# Patient Record
Sex: Female | Born: 1960 | Race: White | Hispanic: No | Smoking: Current every day smoker
Health system: Southern US, Community
[De-identification: ages and names within clinical notes are randomized; demographics above are authoritative.]

## PROBLEM LIST (undated history)

## (undated) DIAGNOSIS — M5432 Sciatica, left side: Secondary | ICD-10-CM

## (undated) DIAGNOSIS — F410 Panic disorder [episodic paroxysmal anxiety] without agoraphobia: Secondary | ICD-10-CM

## (undated) DIAGNOSIS — E785 Hyperlipidemia, unspecified: Secondary | ICD-10-CM

## (undated) DIAGNOSIS — F322 Major depressive disorder, single episode, severe without psychotic features: Secondary | ICD-10-CM

## (undated) DIAGNOSIS — E2839 Other primary ovarian failure: Secondary | ICD-10-CM

## (undated) DIAGNOSIS — D72829 Elevated white blood cell count, unspecified: Secondary | ICD-10-CM

## (undated) DIAGNOSIS — N390 Urinary tract infection, site not specified: Secondary | ICD-10-CM

## (undated) DIAGNOSIS — R161 Splenomegaly, not elsewhere classified: Secondary | ICD-10-CM

## (undated) DIAGNOSIS — J45909 Unspecified asthma, uncomplicated: Secondary | ICD-10-CM

## (undated) DIAGNOSIS — F17210 Nicotine dependence, cigarettes, uncomplicated: Secondary | ICD-10-CM

## (undated) DIAGNOSIS — I771 Stricture of artery: Secondary | ICD-10-CM

## (undated) DIAGNOSIS — K219 Gastro-esophageal reflux disease without esophagitis: Secondary | ICD-10-CM

## (undated) DIAGNOSIS — R1011 Right upper quadrant pain: Secondary | ICD-10-CM

## (undated) DIAGNOSIS — G4762 Sleep related leg cramps: Secondary | ICD-10-CM

## (undated) DIAGNOSIS — I774 Celiac artery compression syndrome: Secondary | ICD-10-CM

## (undated) DIAGNOSIS — J302 Other seasonal allergic rhinitis: Secondary | ICD-10-CM

## (undated) DIAGNOSIS — I10 Essential (primary) hypertension: Secondary | ICD-10-CM

## (undated) HISTORY — DX: Right upper quadrant pain: R10.11

## (undated) HISTORY — DX: Unspecified asthma, uncomplicated: J45.909

## (undated) HISTORY — DX: Celiac artery compression syndrome: I77.4

## (undated) HISTORY — DX: Urinary tract infection, site not specified: N39.0

## (undated) HISTORY — DX: Elevated white blood cell count, unspecified: D72.829

## (undated) HISTORY — DX: Other primary ovarian failure: E28.39

## (undated) HISTORY — DX: Gastro-esophageal reflux disease without esophagitis: K21.9

## (undated) HISTORY — DX: Sleep related leg cramps: G47.62

## (undated) HISTORY — DX: Stricture of artery: I77.1

## (undated) HISTORY — DX: Other seasonal allergic rhinitis: J30.2

## (undated) HISTORY — DX: Sciatica, left side: M54.32

## (undated) HISTORY — DX: Essential (primary) hypertension: I10

## (undated) HISTORY — DX: Splenomegaly, not elsewhere classified: R16.1

## (undated) HISTORY — DX: Hyperlipidemia, unspecified: E78.5

## (undated) HISTORY — DX: Panic disorder (episodic paroxysmal anxiety): F41.0

## (undated) HISTORY — DX: Major depressive disorder, single episode, severe without psychotic features: F32.2

## (undated) HISTORY — PX: KIDNEY STONE SURGERY: SHX686

## (undated) HISTORY — DX: Nicotine dependence, cigarettes, uncomplicated: F17.210

---
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
## 1984-08-11 HISTORY — PX: CHOLECYSTECTOMY: SHX55

## 1987-08-12 HISTORY — PX: ABDOMINAL HYSTERECTOMY: SHX81

## 2003-08-12 HISTORY — PX: NASAL SINUS SURGERY: SHX719

## 2007-04-08 ENCOUNTER — Ambulatory Visit (HOSPITAL_COMMUNITY): Admission: RE | Admit: 2007-04-08 | Discharge: 2007-04-08 | Payer: Self-pay | Admitting: Obstetrics and Gynecology

## 2007-07-22 ENCOUNTER — Ambulatory Visit (HOSPITAL_COMMUNITY): Admission: RE | Admit: 2007-07-22 | Discharge: 2007-07-22 | Payer: Self-pay | Admitting: Family Medicine

## 2008-04-11 ENCOUNTER — Other Ambulatory Visit: Admission: RE | Admit: 2008-04-11 | Discharge: 2008-04-11 | Payer: Self-pay | Admitting: Obstetrics and Gynecology

## 2009-12-21 ENCOUNTER — Ambulatory Visit (HOSPITAL_COMMUNITY): Admission: RE | Admit: 2009-12-21 | Discharge: 2009-12-21 | Payer: Self-pay | Admitting: Family Medicine

## 2010-03-29 ENCOUNTER — Ambulatory Visit (HOSPITAL_COMMUNITY): Admission: RE | Admit: 2010-03-29 | Discharge: 2010-03-29 | Payer: Self-pay | Admitting: Family Medicine

## 2010-08-11 HISTORY — PX: BACK SURGERY: SHX140

## 2010-09-01 ENCOUNTER — Encounter: Payer: Self-pay | Admitting: Obstetrics and Gynecology

## 2010-09-01 ENCOUNTER — Encounter: Payer: Self-pay | Admitting: Family Medicine

## 2011-05-21 IMAGING — CR DG LUMBAR SPINE COMPLETE 4+V
5 series · 5 of 5 positions shown · non-contrast
Comparison: None.

CLINICAL DATA: Low back pain

LUMBAR SPINE - COMPLETE 4+ VIEW

[view not recorded (1 of 5)]
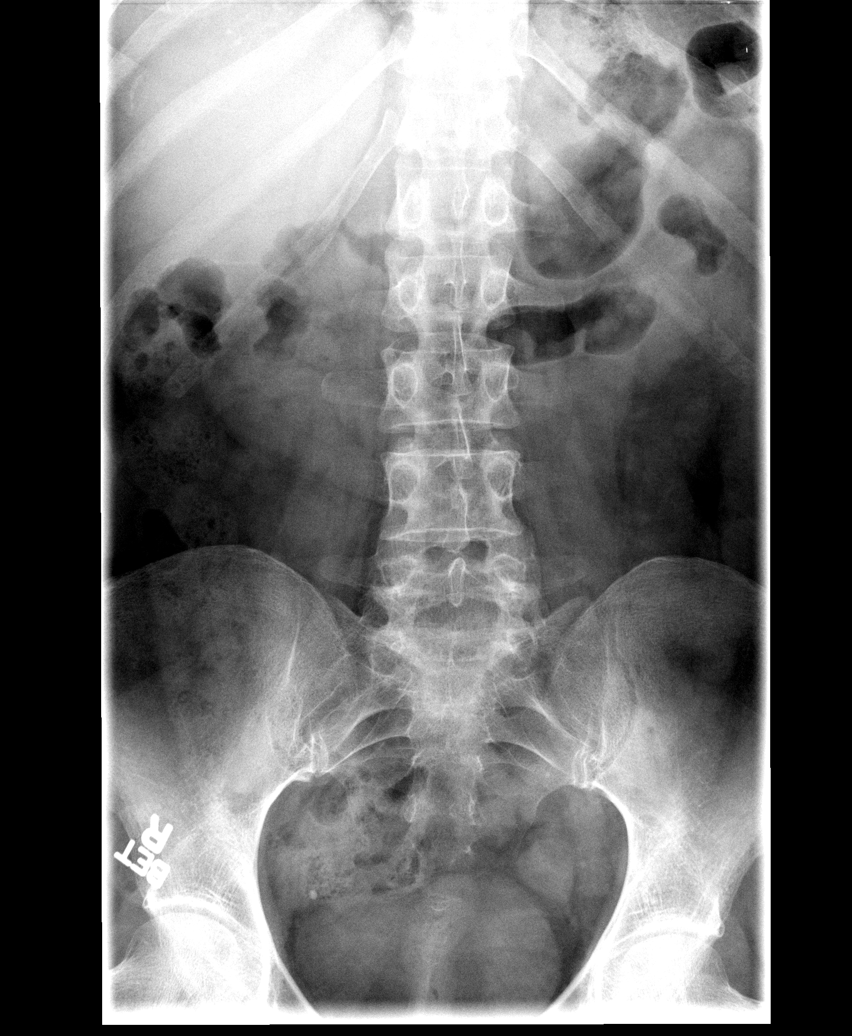

[view not recorded (2 of 5)]
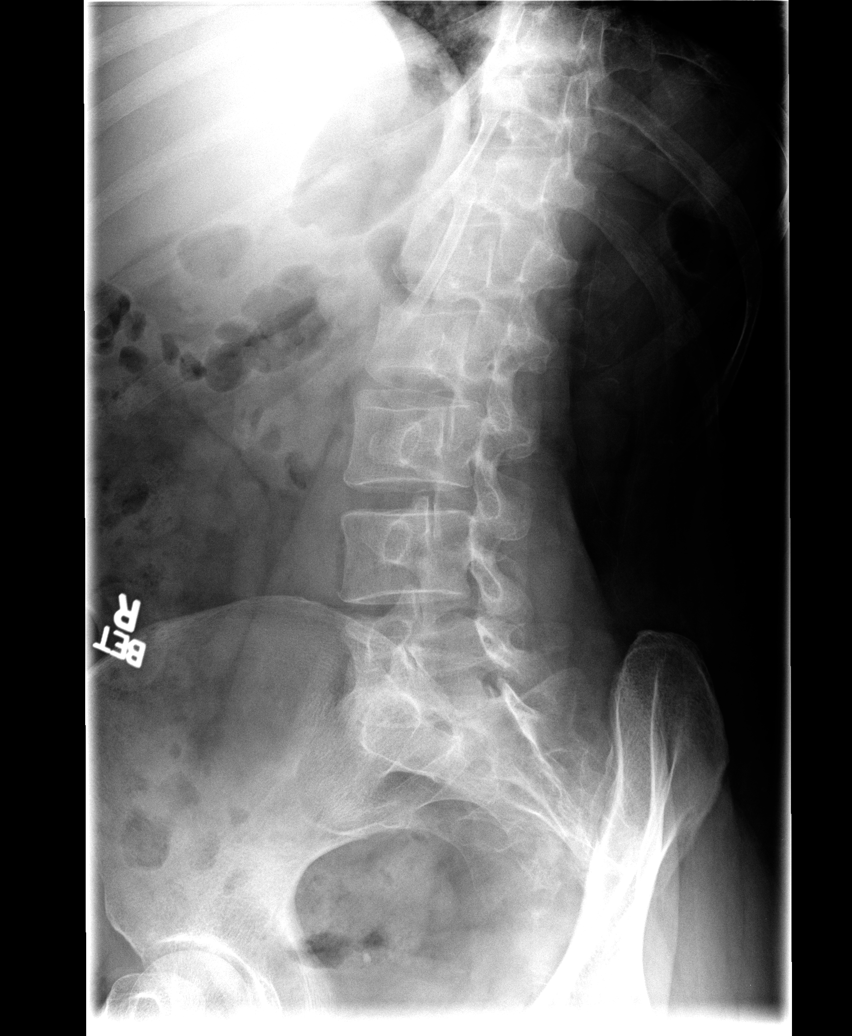

[view not recorded (3 of 5)]
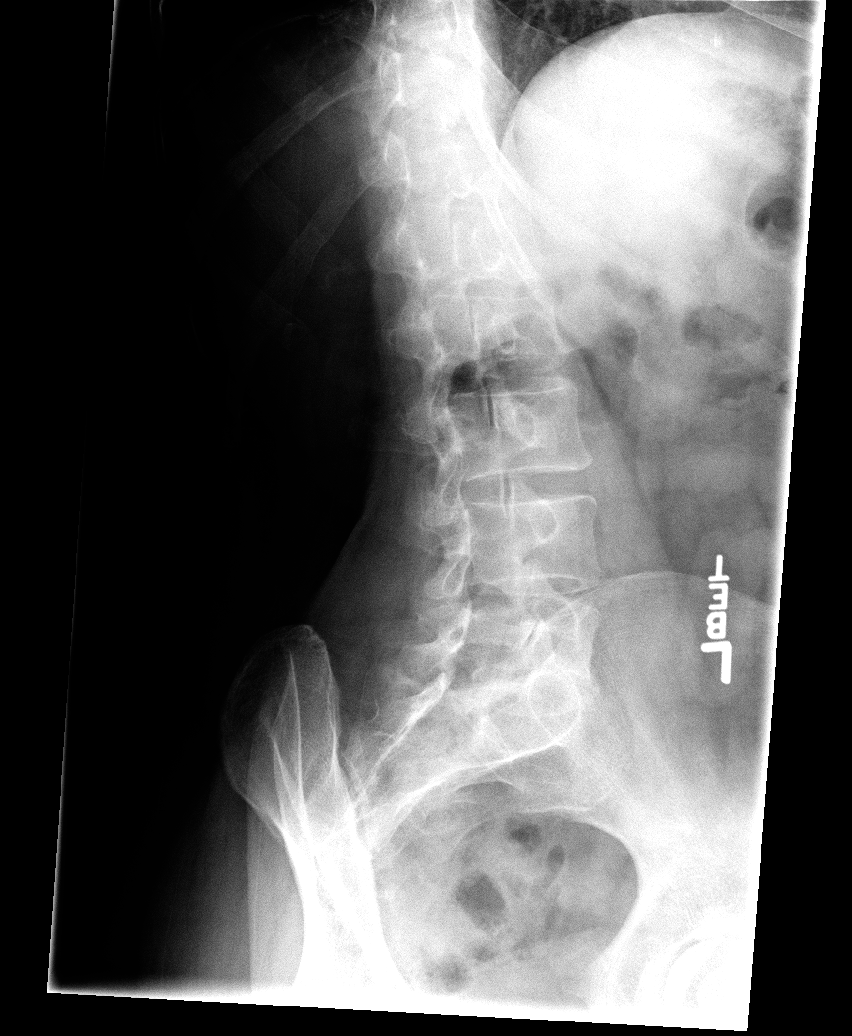

[view not recorded (4 of 5)]
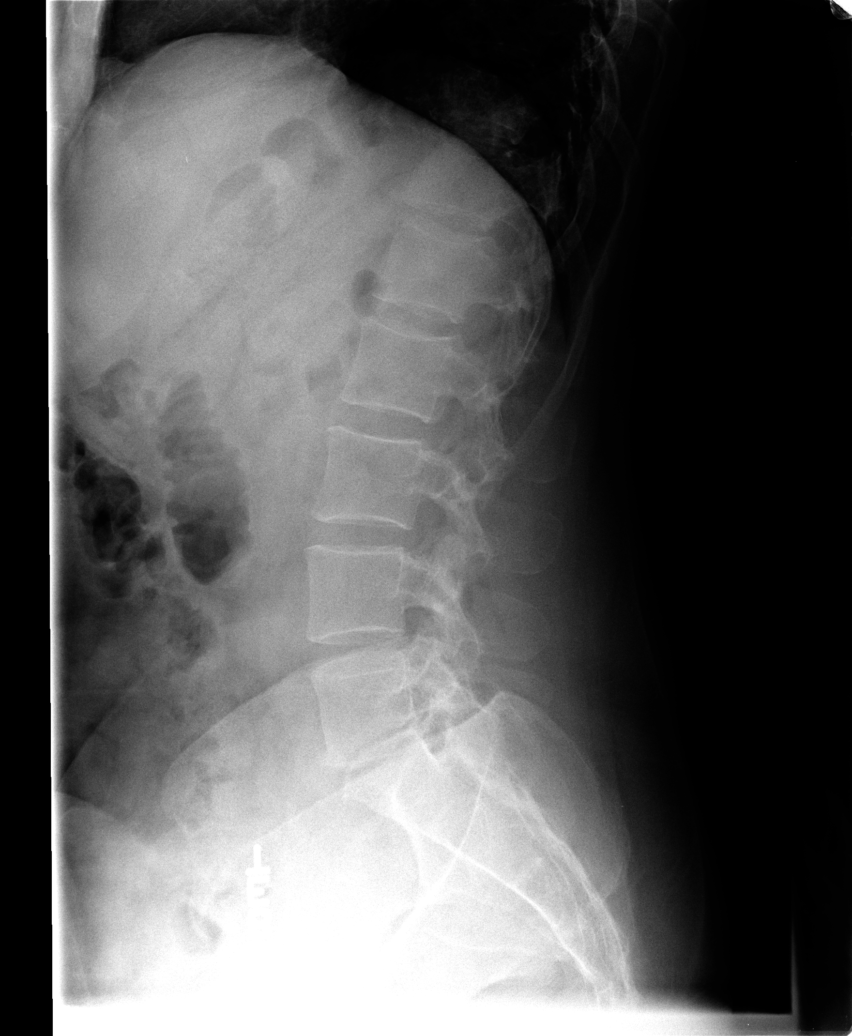

[view not recorded (5 of 5)]
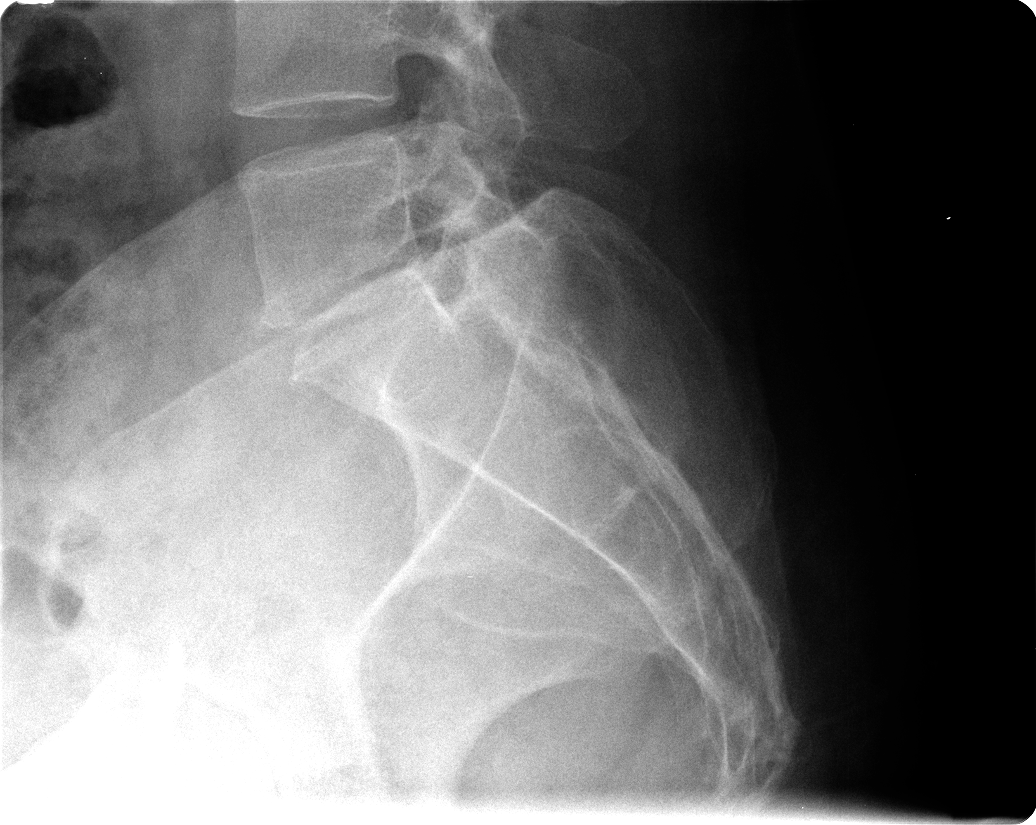

[5 of 5 positions shown; findings below may reference images not displayed]

FINDINGS: There is no evidence of acute fracture or malalignment.
Vertebral body heights and disc spaces are maintained.  Mild
degenerative changes are seen of the facets at L5 S1.  Adjacent
soft tissues are within normal limits.
IMPRESSION: No acute fracture or malalignment.  Mild degenerative changes.

## 2011-05-21 IMAGING — CR DG SACRUM/COCCYX 2+V
3 series · 3 of 3 positions shown · non-contrast
Comparison: 07/22/2007

CLINICAL DATA: Back and buttock pain

SACRUM AND COCCYX - 2+ VIEW

[view not recorded (1 of 3)]
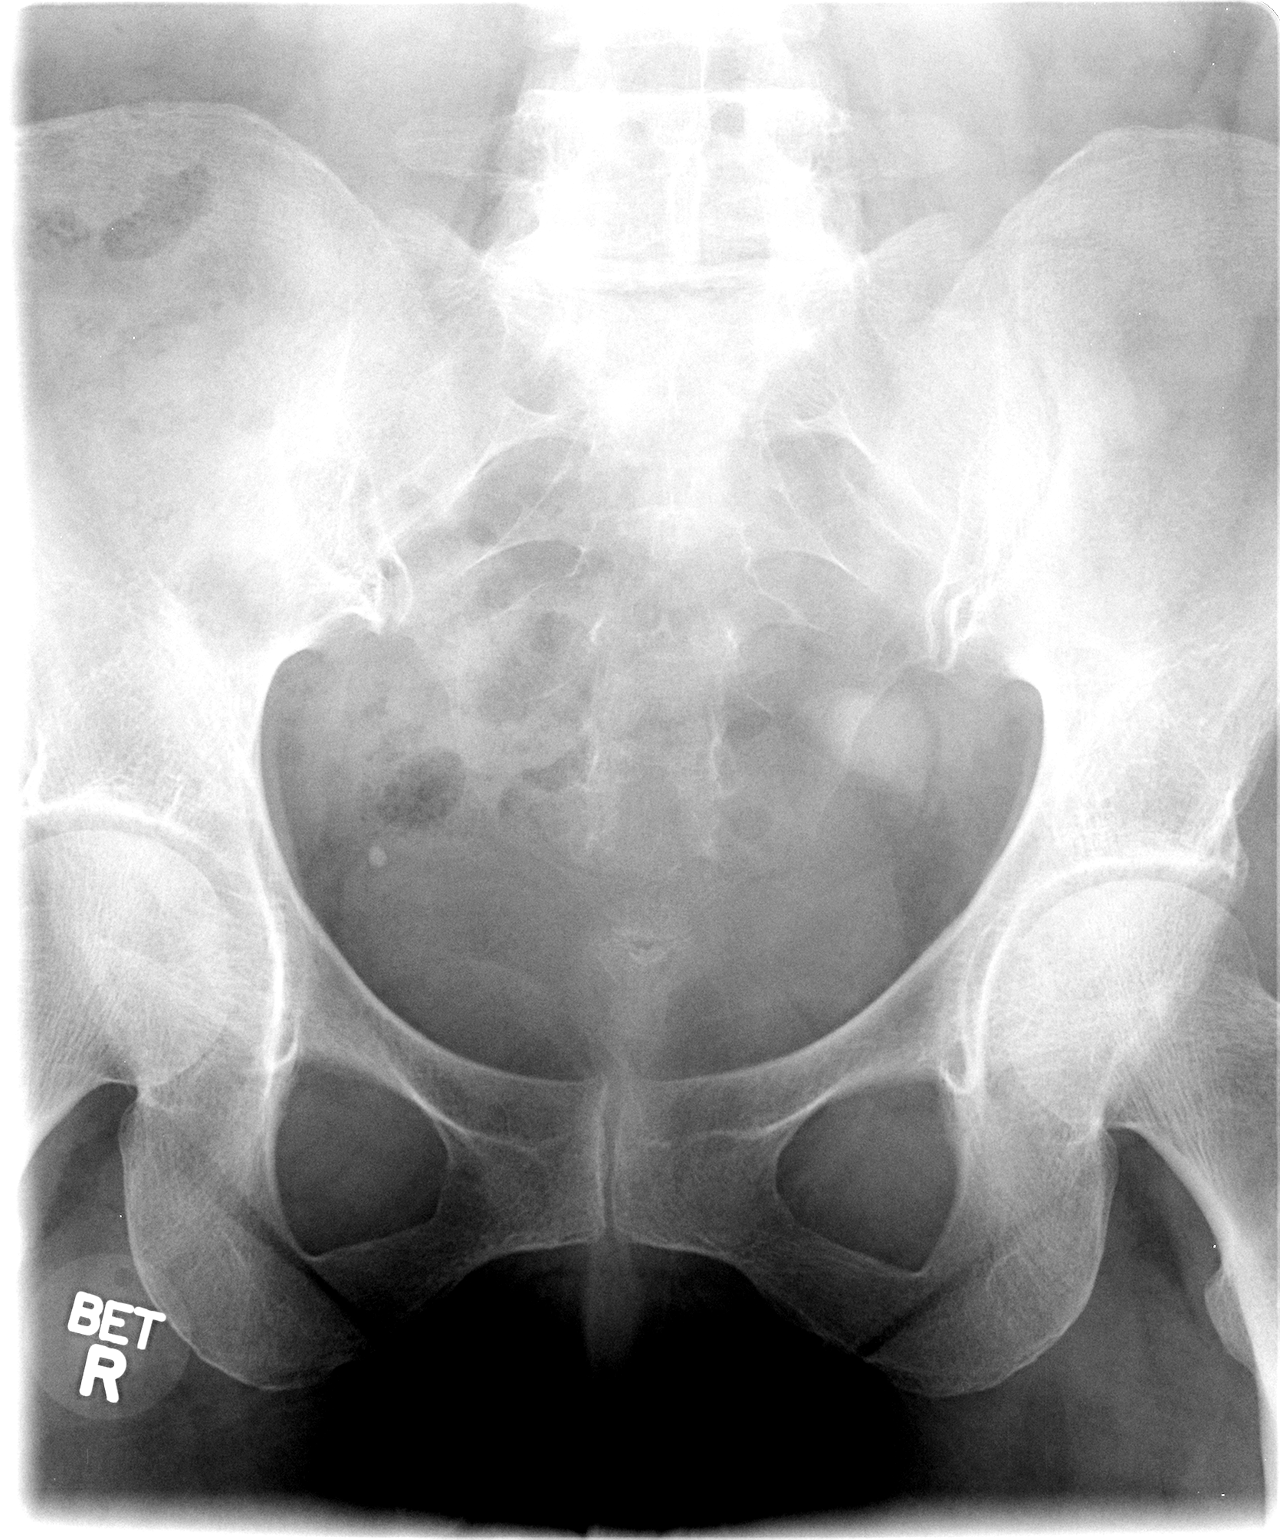

[view not recorded (2 of 3)]
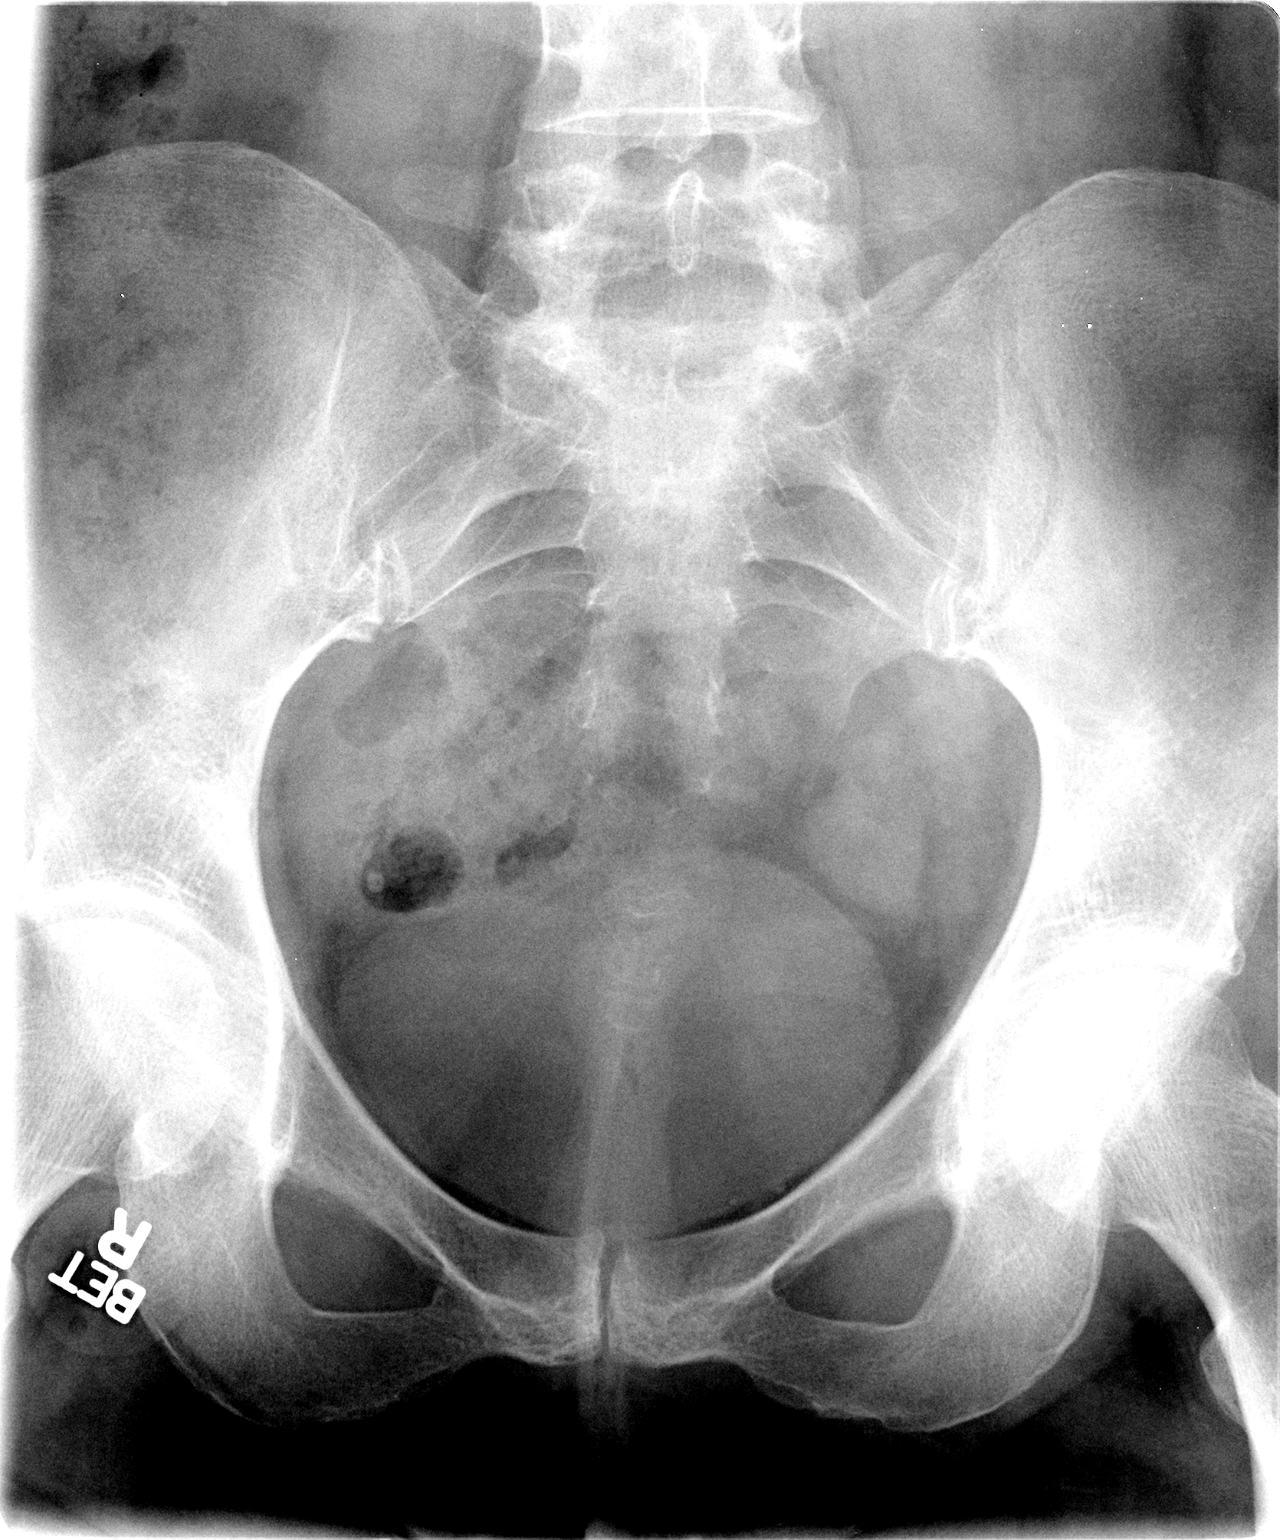

[view not recorded (3 of 3)]
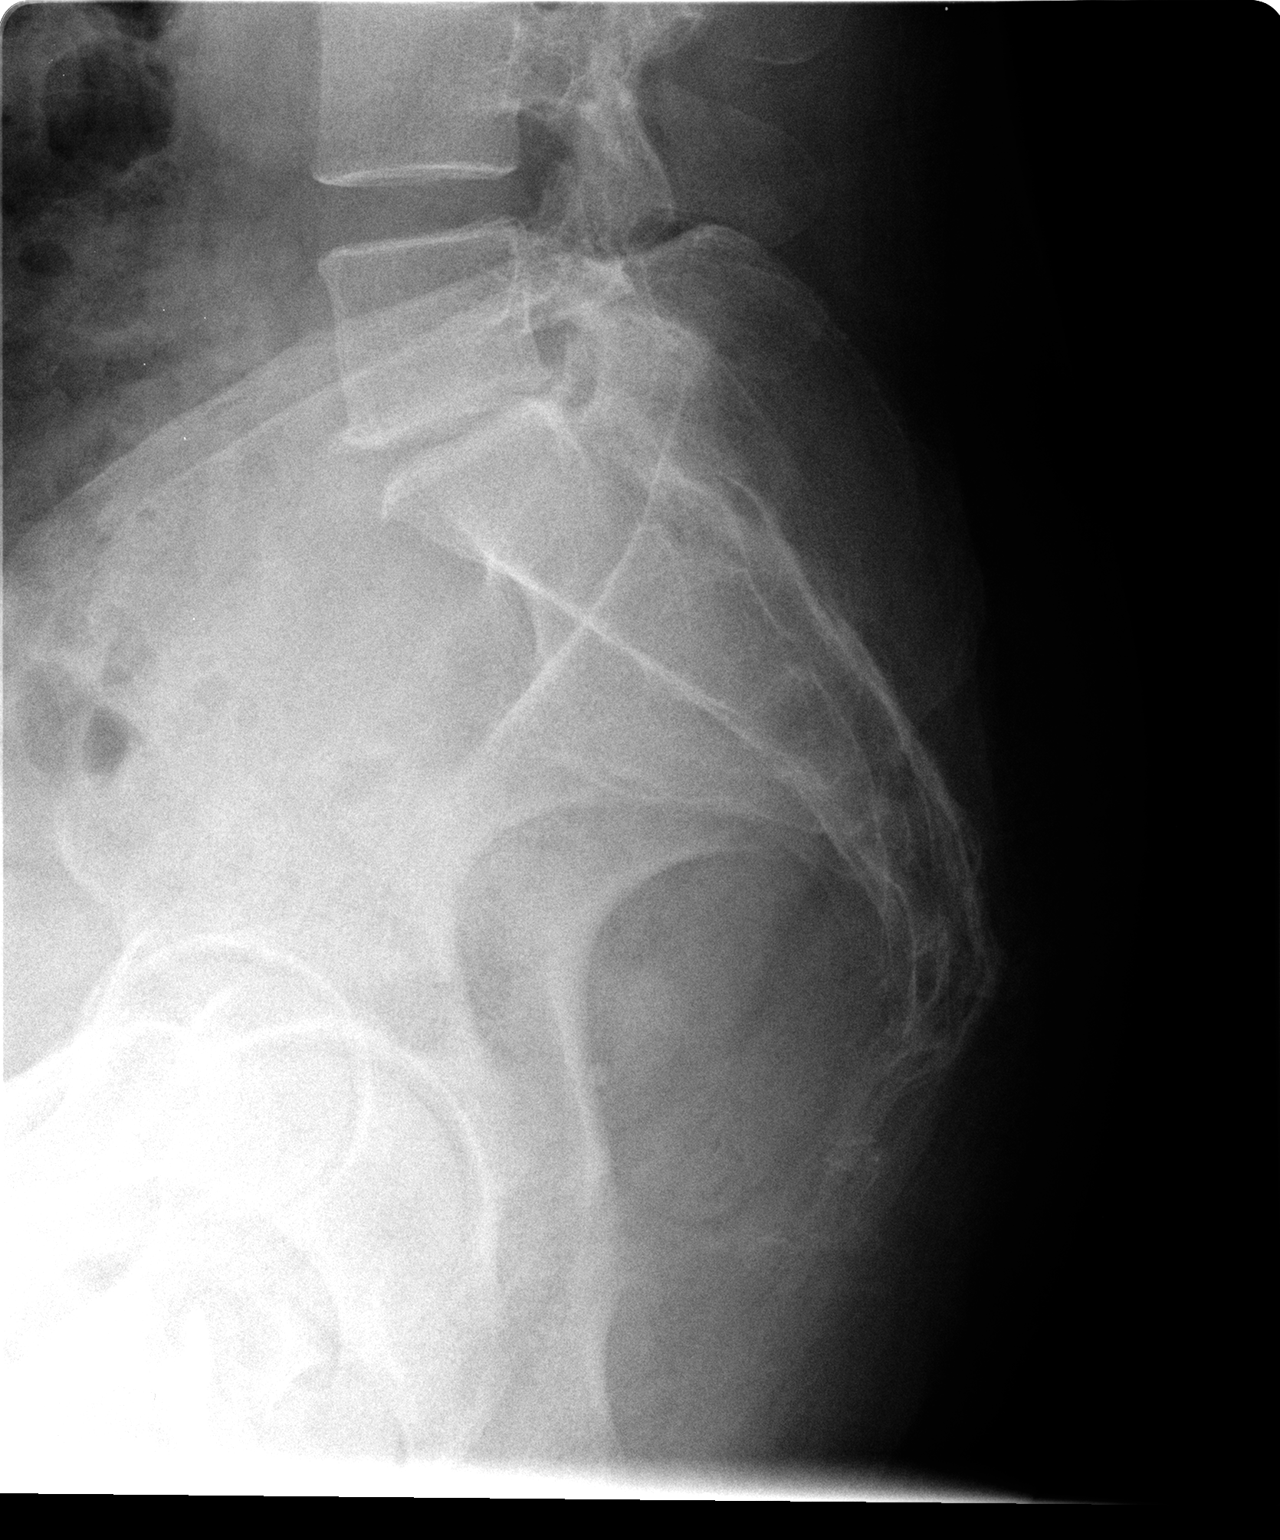

[3 of 3 positions shown; findings below may reference images not displayed]

FINDINGS: There is no evidence of acute fracture or malalignment
the sacrum.  The appearance of the distal sacrum is unchanged
compared most recent exam.  The pelvic ring is intact.
IMPRESSION: No acute fracture or malalignment.

## 2019-02-10 DIAGNOSIS — E7800 Pure hypercholesterolemia, unspecified: Secondary | ICD-10-CM | POA: Diagnosis not present

## 2019-02-10 DIAGNOSIS — J4540 Moderate persistent asthma, uncomplicated: Secondary | ICD-10-CM | POA: Diagnosis not present

## 2019-02-10 DIAGNOSIS — I10 Essential (primary) hypertension: Secondary | ICD-10-CM | POA: Diagnosis not present

## 2019-02-15 DIAGNOSIS — R410 Disorientation, unspecified: Secondary | ICD-10-CM | POA: Diagnosis not present

## 2019-02-15 DIAGNOSIS — Z79899 Other long term (current) drug therapy: Secondary | ICD-10-CM | POA: Diagnosis not present

## 2019-02-24 DIAGNOSIS — G4762 Sleep related leg cramps: Secondary | ICD-10-CM | POA: Diagnosis not present

## 2019-02-24 DIAGNOSIS — R600 Localized edema: Secondary | ICD-10-CM | POA: Diagnosis not present

## 2019-03-10 DIAGNOSIS — J019 Acute sinusitis, unspecified: Secondary | ICD-10-CM | POA: Diagnosis not present

## 2019-03-30 DIAGNOSIS — M810 Age-related osteoporosis without current pathological fracture: Secondary | ICD-10-CM | POA: Diagnosis not present

## 2019-03-30 DIAGNOSIS — M85852 Other specified disorders of bone density and structure, left thigh: Secondary | ICD-10-CM | POA: Diagnosis not present

## 2019-04-14 DIAGNOSIS — N390 Urinary tract infection, site not specified: Secondary | ICD-10-CM | POA: Diagnosis not present

## 2019-04-14 DIAGNOSIS — R0982 Postnasal drip: Secondary | ICD-10-CM | POA: Diagnosis not present

## 2019-04-19 DIAGNOSIS — M5432 Sciatica, left side: Secondary | ICD-10-CM | POA: Diagnosis not present

## 2019-05-17 DIAGNOSIS — I10 Essential (primary) hypertension: Secondary | ICD-10-CM | POA: Diagnosis not present

## 2019-05-17 DIAGNOSIS — J302 Other seasonal allergic rhinitis: Secondary | ICD-10-CM | POA: Diagnosis not present

## 2019-05-19 DIAGNOSIS — Z79899 Other long term (current) drug therapy: Secondary | ICD-10-CM | POA: Diagnosis not present

## 2019-06-02 DIAGNOSIS — M810 Age-related osteoporosis without current pathological fracture: Secondary | ICD-10-CM | POA: Diagnosis not present

## 2019-07-04 DIAGNOSIS — K219 Gastro-esophageal reflux disease without esophagitis: Secondary | ICD-10-CM | POA: Diagnosis not present

## 2019-07-04 DIAGNOSIS — J40 Bronchitis, not specified as acute or chronic: Secondary | ICD-10-CM | POA: Diagnosis not present

## 2019-07-04 DIAGNOSIS — R05 Cough: Secondary | ICD-10-CM | POA: Diagnosis not present

## 2019-07-14 DIAGNOSIS — J4540 Moderate persistent asthma, uncomplicated: Secondary | ICD-10-CM | POA: Diagnosis not present

## 2019-07-14 DIAGNOSIS — K219 Gastro-esophageal reflux disease without esophagitis: Secondary | ICD-10-CM | POA: Diagnosis not present

## 2019-07-14 DIAGNOSIS — E7800 Pure hypercholesterolemia, unspecified: Secondary | ICD-10-CM | POA: Diagnosis not present

## 2019-07-14 DIAGNOSIS — Z79899 Other long term (current) drug therapy: Secondary | ICD-10-CM | POA: Diagnosis not present

## 2019-07-14 DIAGNOSIS — I10 Essential (primary) hypertension: Secondary | ICD-10-CM | POA: Diagnosis not present

## 2019-07-26 DIAGNOSIS — R748 Abnormal levels of other serum enzymes: Secondary | ICD-10-CM | POA: Diagnosis not present

## 2019-08-06 DIAGNOSIS — B029 Zoster without complications: Secondary | ICD-10-CM | POA: Diagnosis not present

## 2019-08-09 DIAGNOSIS — B029 Zoster without complications: Secondary | ICD-10-CM | POA: Diagnosis not present

## 2019-09-20 DIAGNOSIS — J0120 Acute ethmoidal sinusitis, unspecified: Secondary | ICD-10-CM | POA: Diagnosis not present

## 2019-09-20 DIAGNOSIS — B373 Candidiasis of vulva and vagina: Secondary | ICD-10-CM | POA: Diagnosis not present

## 2019-09-20 DIAGNOSIS — R519 Headache, unspecified: Secondary | ICD-10-CM | POA: Diagnosis not present

## 2019-11-09 DIAGNOSIS — R6884 Jaw pain: Secondary | ICD-10-CM | POA: Diagnosis not present

## 2019-11-09 DIAGNOSIS — R220 Localized swelling, mass and lump, head: Secondary | ICD-10-CM | POA: Diagnosis not present

## 2019-11-09 DIAGNOSIS — K047 Periapical abscess without sinus: Secondary | ICD-10-CM | POA: Diagnosis not present

## 2020-01-19 DIAGNOSIS — Z79899 Other long term (current) drug therapy: Secondary | ICD-10-CM | POA: Diagnosis not present

## 2020-01-19 DIAGNOSIS — R109 Unspecified abdominal pain: Secondary | ICD-10-CM | POA: Diagnosis not present

## 2020-01-19 DIAGNOSIS — R69 Illness, unspecified: Secondary | ICD-10-CM | POA: Diagnosis not present

## 2020-01-19 DIAGNOSIS — M5432 Sciatica, left side: Secondary | ICD-10-CM | POA: Diagnosis not present

## 2020-01-19 DIAGNOSIS — E7800 Pure hypercholesterolemia, unspecified: Secondary | ICD-10-CM | POA: Diagnosis not present

## 2020-01-19 DIAGNOSIS — I10 Essential (primary) hypertension: Secondary | ICD-10-CM | POA: Diagnosis not present

## 2020-01-19 DIAGNOSIS — J4540 Moderate persistent asthma, uncomplicated: Secondary | ICD-10-CM | POA: Diagnosis not present

## 2020-03-02 DIAGNOSIS — H524 Presbyopia: Secondary | ICD-10-CM | POA: Diagnosis not present

## 2020-03-14 DIAGNOSIS — R300 Dysuria: Secondary | ICD-10-CM | POA: Diagnosis not present

## 2020-04-05 DIAGNOSIS — L0291 Cutaneous abscess, unspecified: Secondary | ICD-10-CM | POA: Diagnosis not present

## 2020-04-19 DIAGNOSIS — R109 Unspecified abdominal pain: Secondary | ICD-10-CM | POA: Diagnosis not present

## 2020-04-21 DIAGNOSIS — R109 Unspecified abdominal pain: Secondary | ICD-10-CM | POA: Diagnosis not present

## 2020-04-21 DIAGNOSIS — I774 Celiac artery compression syndrome: Secondary | ICD-10-CM | POA: Diagnosis not present

## 2020-04-21 DIAGNOSIS — M810 Age-related osteoporosis without current pathological fracture: Secondary | ICD-10-CM | POA: Diagnosis not present

## 2020-04-21 DIAGNOSIS — I10 Essential (primary) hypertension: Secondary | ICD-10-CM | POA: Diagnosis not present

## 2020-04-21 DIAGNOSIS — Z79899 Other long term (current) drug therapy: Secondary | ICD-10-CM | POA: Diagnosis not present

## 2020-04-21 DIAGNOSIS — J449 Chronic obstructive pulmonary disease, unspecified: Secondary | ICD-10-CM | POA: Diagnosis not present

## 2020-04-21 DIAGNOSIS — R69 Illness, unspecified: Secondary | ICD-10-CM | POA: Diagnosis not present

## 2020-04-21 DIAGNOSIS — K449 Diaphragmatic hernia without obstruction or gangrene: Secondary | ICD-10-CM | POA: Diagnosis not present

## 2020-04-21 DIAGNOSIS — I700 Atherosclerosis of aorta: Secondary | ICD-10-CM | POA: Diagnosis not present

## 2020-04-21 DIAGNOSIS — R079 Chest pain, unspecified: Secondary | ICD-10-CM | POA: Diagnosis not present

## 2020-04-21 DIAGNOSIS — D1809 Hemangioma of other sites: Secondary | ICD-10-CM | POA: Diagnosis not present

## 2020-04-24 DIAGNOSIS — R1012 Left upper quadrant pain: Secondary | ICD-10-CM | POA: Diagnosis not present

## 2020-04-24 DIAGNOSIS — I774 Celiac artery compression syndrome: Secondary | ICD-10-CM | POA: Diagnosis not present

## 2020-04-24 DIAGNOSIS — R748 Abnormal levels of other serum enzymes: Secondary | ICD-10-CM | POA: Diagnosis not present

## 2020-04-30 DIAGNOSIS — K760 Fatty (change of) liver, not elsewhere classified: Secondary | ICD-10-CM | POA: Diagnosis not present

## 2020-04-30 DIAGNOSIS — Z9049 Acquired absence of other specified parts of digestive tract: Secondary | ICD-10-CM | POA: Diagnosis not present

## 2020-04-30 DIAGNOSIS — R161 Splenomegaly, not elsewhere classified: Secondary | ICD-10-CM | POA: Diagnosis not present

## 2020-04-30 DIAGNOSIS — R1011 Right upper quadrant pain: Secondary | ICD-10-CM | POA: Diagnosis not present

## 2020-04-30 DIAGNOSIS — D7389 Other diseases of spleen: Secondary | ICD-10-CM | POA: Diagnosis not present

## 2020-04-30 DIAGNOSIS — N281 Cyst of kidney, acquired: Secondary | ICD-10-CM | POA: Diagnosis not present

## 2020-05-15 ENCOUNTER — Other Ambulatory Visit: Payer: Self-pay

## 2020-06-14 ENCOUNTER — Ambulatory Visit (INDEPENDENT_AMBULATORY_CARE_PROVIDER_SITE_OTHER): Payer: Medicare HMO | Admitting: Vascular Surgery

## 2020-06-14 ENCOUNTER — Other Ambulatory Visit: Payer: Self-pay

## 2020-06-14 ENCOUNTER — Encounter: Payer: Self-pay | Admitting: Vascular Surgery

## 2020-06-14 VITALS — BP 175/107 | HR 77 | Temp 98.1°F | Resp 20 | Ht 66.0 in | Wt 199.0 lb

## 2020-06-14 DIAGNOSIS — I774 Celiac artery compression syndrome: Secondary | ICD-10-CM | POA: Diagnosis not present

## 2020-06-14 DIAGNOSIS — I771 Stricture of artery: Secondary | ICD-10-CM

## 2020-06-14 NOTE — Progress Notes (Signed)
Referring Physician: Haydee Salter NP  Patient name: Taylor Porter MRN: [id number] DOB: 11-25-1960 Sex: female  REASON FOR CONSULT: Abdominal pain with celiac artery stenosis  HPI: Taylor Porter is a 59 y.o. female, with a one-time episode of abdominal pain approximately 6 weeks ago. She was seen in the emergency room at [facility] where she had a CT scan abdomen and pelvis performed which showed narrowing of the celiac artery. She states she has not had any abdominal pain for months. She had never had any prior abdominal pain. She has not had any weight loss. She does not have any food fear. She currently smokes half a pack of cigarettes per day. He is trying to quit. She is on a statin.  Other medical problems include back pain and on disability. She also has hypertension which is stable.  Past Medical History:  Diagnosis Date  . Asthma   . Celiac artery stenosis ([place])   . Cigarette smoker   . Estrogen deficiency   . GERD without esophagitis   . Hyperlipidemia    Mixed  . Hypertension    Essential  . Leukocytosis   . Nocturnal leg cramps   . Panic disorder (episodic paroxysmal anxiety)   . Right upper quadrant abdominal pain   . Sciatica of left side   . Seasonal allergic rhinitis   . 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 Splenic mass   . UTI (urinary tract infection)    Past Surgical History:  Procedure Laterality Date  . ABDOMINAL HYSTERECTOMY  1989  . BACK SURGERY  2012   L4-L5.   [facility] Kitchen CHOLECYSTECTOMY  1986  . KIDNEY STONE SURGERY    . NASAL SINUS SURGERY  2005  . TONSILLECTOMY  1978    Family History  Problem Relation Age of Onset  . Hypertension Mother   . Diabetes Father   . Cancer Maternal Grandmother   . Stroke Maternal Grandmother   . Cancer Paternal Grandfather     SOCIAL HISTORY: Social History   Socioeconomic History  . Marital status: Legally Separated    Spouse name: Not on file  . Number of children: 2  .  Years of education: Not on file  . Highest education level: Not on file  Occupational History  . Not on file  Tobacco Use  . Smoking status: Current Every Day Smoker    Packs/day: 0.50    Types: Cigarettes  . Smokeless tobacco: Never Used  . Tobacco comment: 5 cigarettes or less  Vaping Use  . Vaping Use: Never used  Substance and Sexual Activity  . Alcohol use: Not Currently  . Drug use: Never  . Sexual activity: Not on file  Other Topics Concern  . Not on file  Social History Narrative  . Not on file   Social Determinants of Health   Financial Resource Strain:   . Difficulty of Paying Living Expenses: Not on file  Food Insecurity:   . Worried About Charity fundraiser in the Last Year: Not on file  . Ran Out of Food in the Last Year: Not on file  Transportation Needs:   . Lack of Transportation (Medical): Not on file  . Lack of Transportation (Non-Medical): Not on file  Physical Activity:   . Days of Exercise per Week: Not on file  . Minutes of Exercise per Session: Not on file  Stress:   . Feeling of Stress : Not on file  Social Connections:   .  Frequency of Communication with Friends and Family: Not on file  . Frequency of Social Gatherings with Friends and Family: Not on file  . Attends Religious Services: Not on file  . Active Member of Clubs or Organizations: Not on file  . Attends Archivist Meetings: Not on file  . Marital Status: Not on file  Intimate Partner Violence:   . Fear of Current or Ex-Partner: Not on file  . Emotionally Abused: Not on file  . Physically Abused: Not on file  . Sexually Abused: Not on file    Allergies  Allergen Reactions  . Fosamax [Alendronate] Nausea And Vomiting  . Sulfa Antibiotics     Current Outpatient Medications  Medication Sig Dispense Refill  . albuterol (VENTOLIN HFA) 108 (90 Base) MCG/ACT inhaler     . atorvastatin (LIPITOR) 10 MG tablet     . cyclobenzaprine (FLEXERIL) 10 MG tablet     .  fluticasone (FLONASE) 50 MCG/ACT nasal spray     . gabapentin (NEURONTIN) 600 MG tablet Take 600 mg by mouth 3 (three) times daily. 2 capsules    . metoprolol tartrate (LOPRESSOR) 25 MG tablet     . montelukast (SINGULAIR) 10 MG tablet Take 10 mg by mouth at bedtime.    . pantoprazole (PROTONIX) 40 MG tablet     . SYMBICORT 160-4.5 MCG/ACT inhaler     . vortioxetine HBr (TRINTELLIX) 10 MG TABS tablet Take 10 mg by mouth daily.     No current facility-administered medications for this visit.    ROS:   General:  No weight loss, Fever, chills  HEENT: No recent headaches, no nasal bleeding, no visual changes, no sore throat  Neurologic: No dizziness, blackouts, seizures. No recent symptoms of stroke or mini- stroke. No recent episodes of slurred speech, or temporary blindness.  Cardiac: No recent episodes of chest pain/pressure, no shortness of breath at rest.  No shortness of breath with exertion.  Denies history of atrial fibrillation or irregular heartbeat  Vascular: No history of rest pain in feet.  No history of claudication.  No history of non-healing ulcer, No history of DVT   Pulmonary: No home oxygen, no productive cough, no hemoptysis,  No asthma or wheezing  Musculoskeletal:  [ ]  Arthritis, [X]  Low back pain,  [ ]  Joint pain  Hematologic:No history of hypercoagulable state.  No history of easy bleeding.  No history of anemia  Gastrointestinal: No hematochezia or melena,  No gastroesophageal reflux, no trouble swallowing  Urinary: [ ]  chronic Kidney disease, [ ]  on HD - [ ]  MWF or [ ]  TTHS, [ ]  Burning with urination, [ ]  Frequent urination, [ ]  Difficulty urinating;   Skin: No rashes  Psychological: No history of anxiety,  No history of depression   Physical Examination  Vitals:   06/14/20 0859  BP: (!) 175/107  Pulse: 77  Resp: 20  Temp: 98.1 F (36.7 C)  SpO2: 94%  Weight: 199 lb (90.3 kg)  Height: 5\' 6"  (1.676 m)    Body mass index is 32.12  kg/m.  General:  Alert and oriented, no acute distress HEENT: Normal Neck: No JVD Pulmonary: Clear to auscultation bilaterally Cardiac: Regular Rate and Rhythm Abdomen: Soft, non-tender, non-distended, no mass, obese Skin: No rash Extremity Pulses:  2+ radial, brachial, femoral, plus right absent left dorsalis pedis, 2+ left posterior tibial pulses Musculoskeletal: No deformity or edema  Neurologic: Upper and lower extremity motor 5/5 and symmetric  DATA:  I reviewed the images  of the patient's recent CT angio of the abdomen and pelvis at [facility] dated 05/01/2020. This shows narrowing of the celiac artery widely patent superior mesenteric artery no evidence of aneurysm widely patent renal arteries.  ASSESSMENT: Narrowing of celiac artery. This can be a normal anatomic variant with respiration. I do not believe that she has any element of gut ischemia from this. She has a widely patent SMA which should provide collateral circulation. She currently is asymptomatic.   PLAN: Patient will follow up on an as-needed basis if she develops food fear worsening abdominal pain or weight loss.  She will try to quit smoking.   Ruta Hinds, MD [facility] [place] Office: [phone]

## 2020-07-31 DIAGNOSIS — R69 Illness, unspecified: Secondary | ICD-10-CM | POA: Diagnosis not present

## 2020-07-31 DIAGNOSIS — M5442 Lumbago with sciatica, left side: Secondary | ICD-10-CM | POA: Diagnosis not present

## 2020-07-31 DIAGNOSIS — Z9889 Other specified postprocedural states: Secondary | ICD-10-CM | POA: Diagnosis not present

## 2020-08-24 DIAGNOSIS — M5442 Lumbago with sciatica, left side: Secondary | ICD-10-CM | POA: Diagnosis not present

## 2020-08-24 DIAGNOSIS — M545 Low back pain, unspecified: Secondary | ICD-10-CM | POA: Diagnosis not present

## 2020-09-27 DIAGNOSIS — Z7189 Other specified counseling: Secondary | ICD-10-CM | POA: Diagnosis not present

## 2020-09-27 DIAGNOSIS — M5441 Lumbago with sciatica, right side: Secondary | ICD-10-CM | POA: Diagnosis not present

## 2020-09-27 DIAGNOSIS — R69 Illness, unspecified: Secondary | ICD-10-CM | POA: Diagnosis not present

## 2020-09-27 DIAGNOSIS — Z6831 Body mass index (BMI) 31.0-31.9, adult: Secondary | ICD-10-CM | POA: Diagnosis not present

## 2020-09-27 DIAGNOSIS — Z1339 Encounter for screening examination for other mental health and behavioral disorders: Secondary | ICD-10-CM | POA: Diagnosis not present

## 2020-09-27 DIAGNOSIS — Z1331 Encounter for screening for depression: Secondary | ICD-10-CM | POA: Diagnosis not present

## 2020-09-27 DIAGNOSIS — Z1231 Encounter for screening mammogram for malignant neoplasm of breast: Secondary | ICD-10-CM | POA: Diagnosis not present

## 2020-09-27 DIAGNOSIS — E2839 Other primary ovarian failure: Secondary | ICD-10-CM | POA: Diagnosis not present

## 2020-09-27 DIAGNOSIS — Z0001 Encounter for general adult medical examination with abnormal findings: Secondary | ICD-10-CM | POA: Diagnosis not present

## 2020-09-27 DIAGNOSIS — M5442 Lumbago with sciatica, left side: Secondary | ICD-10-CM | POA: Diagnosis not present

## 2020-10-31 DIAGNOSIS — R69 Illness, unspecified: Secondary | ICD-10-CM | POA: Diagnosis not present

## 2020-10-31 DIAGNOSIS — J40 Bronchitis, not specified as acute or chronic: Secondary | ICD-10-CM | POA: Diagnosis not present

## 2020-10-31 DIAGNOSIS — Z6830 Body mass index (BMI) 30.0-30.9, adult: Secondary | ICD-10-CM | POA: Diagnosis not present

## 2020-11-01 DIAGNOSIS — Z20828 Contact with and (suspected) exposure to other viral communicable diseases: Secondary | ICD-10-CM | POA: Diagnosis not present

## 2020-11-09 DIAGNOSIS — Z6831 Body mass index (BMI) 31.0-31.9, adult: Secondary | ICD-10-CM | POA: Diagnosis not present

## 2020-11-09 DIAGNOSIS — J40 Bronchitis, not specified as acute or chronic: Secondary | ICD-10-CM | POA: Diagnosis not present

## 2020-12-12 DIAGNOSIS — M5441 Lumbago with sciatica, right side: Secondary | ICD-10-CM | POA: Diagnosis not present

## 2020-12-25 DIAGNOSIS — M5440 Lumbago with sciatica, unspecified side: Secondary | ICD-10-CM | POA: Diagnosis not present

## 2020-12-26 DIAGNOSIS — R600 Localized edema: Secondary | ICD-10-CM | POA: Diagnosis not present

## 2020-12-26 DIAGNOSIS — Z6832 Body mass index (BMI) 32.0-32.9, adult: Secondary | ICD-10-CM | POA: Diagnosis not present

## 2020-12-26 DIAGNOSIS — I10 Essential (primary) hypertension: Secondary | ICD-10-CM | POA: Diagnosis not present

## 2020-12-26 DIAGNOSIS — R69 Illness, unspecified: Secondary | ICD-10-CM | POA: Diagnosis not present

## 2020-12-26 DIAGNOSIS — E7800 Pure hypercholesterolemia, unspecified: Secondary | ICD-10-CM | POA: Diagnosis not present

## 2020-12-26 DIAGNOSIS — Z79899 Other long term (current) drug therapy: Secondary | ICD-10-CM | POA: Diagnosis not present

## 2020-12-26 DIAGNOSIS — K219 Gastro-esophageal reflux disease without esophagitis: Secondary | ICD-10-CM | POA: Diagnosis not present

## 2020-12-26 DIAGNOSIS — M5441 Lumbago with sciatica, right side: Secondary | ICD-10-CM | POA: Diagnosis not present

## 2020-12-26 DIAGNOSIS — R5383 Other fatigue: Secondary | ICD-10-CM | POA: Diagnosis not present

## 2021-01-18 DIAGNOSIS — M791 Myalgia, unspecified site: Secondary | ICD-10-CM | POA: Diagnosis not present

## 2021-01-18 DIAGNOSIS — I774 Celiac artery compression syndrome: Secondary | ICD-10-CM | POA: Diagnosis not present

## 2021-01-18 DIAGNOSIS — R7401 Elevation of levels of liver transaminase levels: Secondary | ICD-10-CM | POA: Diagnosis not present

## 2021-01-18 DIAGNOSIS — J449 Chronic obstructive pulmonary disease, unspecified: Secondary | ICD-10-CM | POA: Diagnosis not present

## 2021-01-18 DIAGNOSIS — R5383 Other fatigue: Secondary | ICD-10-CM | POA: Diagnosis not present

## 2021-01-18 DIAGNOSIS — R69 Illness, unspecified: Secondary | ICD-10-CM | POA: Diagnosis not present

## 2021-02-14 DIAGNOSIS — M5440 Lumbago with sciatica, unspecified side: Secondary | ICD-10-CM | POA: Diagnosis not present

## 2021-02-14 DIAGNOSIS — M545 Low back pain, unspecified: Secondary | ICD-10-CM | POA: Diagnosis not present

## 2021-03-31 DIAGNOSIS — J019 Acute sinusitis, unspecified: Secondary | ICD-10-CM | POA: Diagnosis not present

## 2021-03-31 DIAGNOSIS — B9689 Other specified bacterial agents as the cause of diseases classified elsewhere: Secondary | ICD-10-CM | POA: Diagnosis not present

## 2021-03-31 DIAGNOSIS — T3695XA Adverse effect of unspecified systemic antibiotic, initial encounter: Secondary | ICD-10-CM | POA: Diagnosis not present

## 2021-03-31 DIAGNOSIS — B379 Candidiasis, unspecified: Secondary | ICD-10-CM | POA: Diagnosis not present

## 2021-04-03 DIAGNOSIS — E2839 Other primary ovarian failure: Secondary | ICD-10-CM | POA: Diagnosis not present

## 2021-04-03 DIAGNOSIS — Z1231 Encounter for screening mammogram for malignant neoplasm of breast: Secondary | ICD-10-CM | POA: Diagnosis not present

## 2021-04-03 DIAGNOSIS — M85852 Other specified disorders of bone density and structure, left thigh: Secondary | ICD-10-CM | POA: Diagnosis not present

## 2021-04-05 DIAGNOSIS — Z6830 Body mass index (BMI) 30.0-30.9, adult: Secondary | ICD-10-CM | POA: Diagnosis not present

## 2021-04-05 DIAGNOSIS — I10 Essential (primary) hypertension: Secondary | ICD-10-CM | POA: Diagnosis not present

## 2021-04-05 DIAGNOSIS — M5440 Lumbago with sciatica, unspecified side: Secondary | ICD-10-CM | POA: Diagnosis not present

## 2021-04-11 DIAGNOSIS — Z79899 Other long term (current) drug therapy: Secondary | ICD-10-CM | POA: Diagnosis not present

## 2021-04-12 DIAGNOSIS — M5459 Other low back pain: Secondary | ICD-10-CM | POA: Diagnosis not present

## 2021-04-12 DIAGNOSIS — M79605 Pain in left leg: Secondary | ICD-10-CM | POA: Diagnosis not present

## 2021-04-12 DIAGNOSIS — M79604 Pain in right leg: Secondary | ICD-10-CM | POA: Diagnosis not present

## 2021-04-17 DIAGNOSIS — M79605 Pain in left leg: Secondary | ICD-10-CM | POA: Diagnosis not present

## 2021-04-17 DIAGNOSIS — M5459 Other low back pain: Secondary | ICD-10-CM | POA: Diagnosis not present

## 2021-04-17 DIAGNOSIS — M79604 Pain in right leg: Secondary | ICD-10-CM | POA: Diagnosis not present

## 2021-04-19 DIAGNOSIS — M79605 Pain in left leg: Secondary | ICD-10-CM | POA: Diagnosis not present

## 2021-04-19 DIAGNOSIS — M79604 Pain in right leg: Secondary | ICD-10-CM | POA: Diagnosis not present

## 2021-04-19 DIAGNOSIS — M5459 Other low back pain: Secondary | ICD-10-CM | POA: Diagnosis not present

## 2021-04-23 DIAGNOSIS — M79605 Pain in left leg: Secondary | ICD-10-CM | POA: Diagnosis not present

## 2021-04-23 DIAGNOSIS — M5459 Other low back pain: Secondary | ICD-10-CM | POA: Diagnosis not present

## 2021-04-23 DIAGNOSIS — M79604 Pain in right leg: Secondary | ICD-10-CM | POA: Diagnosis not present

## 2021-04-24 DIAGNOSIS — R928 Other abnormal and inconclusive findings on diagnostic imaging of breast: Secondary | ICD-10-CM | POA: Diagnosis not present

## 2021-04-24 DIAGNOSIS — N6012 Diffuse cystic mastopathy of left breast: Secondary | ICD-10-CM | POA: Diagnosis not present

## 2021-04-25 DIAGNOSIS — M810 Age-related osteoporosis without current pathological fracture: Secondary | ICD-10-CM | POA: Diagnosis not present

## 2021-04-26 DIAGNOSIS — M5459 Other low back pain: Secondary | ICD-10-CM | POA: Diagnosis not present

## 2021-04-26 DIAGNOSIS — M79605 Pain in left leg: Secondary | ICD-10-CM | POA: Diagnosis not present

## 2021-04-26 DIAGNOSIS — M79604 Pain in right leg: Secondary | ICD-10-CM | POA: Diagnosis not present

## 2021-04-30 DIAGNOSIS — M5440 Lumbago with sciatica, unspecified side: Secondary | ICD-10-CM | POA: Diagnosis not present

## 2021-05-11 DEATH — deceased
